# Patient Record
Sex: Male | Born: 1995 | Race: White | Hispanic: No | Marital: Single | Smoking: Current every day smoker
Health system: Southern US, Community
[De-identification: ages and names within clinical notes are randomized; demographics above are authoritative.]

## PROBLEM LIST (undated history)

## (undated) DIAGNOSIS — K219 Gastro-esophageal reflux disease without esophagitis: Secondary | ICD-10-CM

## (undated) HISTORY — PX: OTHER SURGICAL HISTORY: SHX169

---

## 2016-02-14 ENCOUNTER — Emergency Department (HOSPITAL_COMMUNITY)
Admission: EM | Admit: 2016-02-14 | Discharge: 2016-02-15 | Disposition: A | Discharge status: Home / Self Care | Payer: Self-pay | Attending: Emergency Medicine | Admitting: Emergency Medicine

## 2016-02-14 ENCOUNTER — Encounter (HOSPITAL_COMMUNITY): Payer: Self-pay

## 2016-02-14 ENCOUNTER — Emergency Department (HOSPITAL_COMMUNITY): Payer: Self-pay

## 2016-02-14 DIAGNOSIS — W230XXA Caught, crushed, jammed, or pinched between moving objects, initial encounter: Secondary | ICD-10-CM | POA: Insufficient documentation

## 2016-02-14 DIAGNOSIS — Y990 Civilian activity done for income or pay: Secondary | ICD-10-CM | POA: Insufficient documentation

## 2016-02-14 DIAGNOSIS — S62639B Displaced fracture of distal phalanx of unspecified finger, initial encounter for open fracture: Secondary | ICD-10-CM

## 2016-02-14 DIAGNOSIS — Y9289 Other specified places as the place of occurrence of the external cause: Secondary | ICD-10-CM | POA: Insufficient documentation

## 2016-02-14 DIAGNOSIS — S62665A Nondisplaced fracture of distal phalanx of left ring finger, initial encounter for closed fracture: Secondary | ICD-10-CM | POA: Insufficient documentation

## 2016-02-14 DIAGNOSIS — S61215A Laceration without foreign body of left ring finger without damage to nail, initial encounter: Secondary | ICD-10-CM

## 2016-02-14 DIAGNOSIS — Y9389 Activity, other specified: Secondary | ICD-10-CM | POA: Insufficient documentation

## 2016-02-14 MED ORDER — LIDOCAINE HCL (PF) 1 % IJ SOLN
30.0000 mL | Freq: Once | INTRAMUSCULAR | Status: AC
  Administered 2016-02-14: 30 mL
  Filled 2016-02-14: qty 30

## 2016-02-14 MED ORDER — TETANUS-DIPHTH-ACELL PERTUSSIS 5-2.5-18.5 LF-MCG/0.5 IM SUSP
0.5000 mL | Freq: Once | INTRAMUSCULAR | Status: AC
Start: 2016-02-14 — End: 2016-02-14
  Administered 2016-02-14: 0.5 mL via INTRAMUSCULAR
  Filled 2016-02-14: qty 0.5

## 2016-02-14 NOTE — ED Triage Notes (Signed)
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 Laceration noted. Injury occurred around 1930. Bleeding controlled. A&Ox4.

## 2016-02-14 NOTE — ED Provider Notes (Signed)
[facility]-EMERGENCY DEPT Provider Note   CSN: [id number] Arrival date & time: 02/14/16  2028     History   Chief Complaint Chief Complaint  Patient presents with  . Finger Injury    L ring finger    HPI Javier Perkins is a 21 y.o. male.  Javier Perkins is a 21 y.o. Male who is right hand dominant who presents to the ED complaining of a laceration to his left fourth finger sustained prior to arrival. 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 He reports pain overlying this finger. He is taking nothing for treatment of his symptoms currently. He combines of only mild pain.He is unsure when his last tetanus shot was. He denies numbness, tingling, weakness or other injury.   The history is provided by the patient. No language interpreter was used.    History reviewed. No pertinent past medical history.  There are no active problems to display for this patient.   No past surgical history on file.     Home Medications    Prior to Admission medications   Medication Sig Start Date End Date Taking? Authorizing Provider  cephALEXin (KEFLEX) 500 MG capsule Take 1 capsule (500 mg total) by mouth 3 (three) times daily. 02/15/16   Everlene Farrier, PA-C  naproxen (NAPROSYN) 500 MG tablet Take 1 tablet (500 mg total) by mouth 2 (two) times daily with a meal. 02/15/16   Everlene Farrier, PA-C    Family History History reviewed. No pertinent family history.  Social History Social History  Substance Use Topics  . Smoking status: Not on file  . Smokeless tobacco: Not on file  . Alcohol use Not on file     Allergies   Patient has no known allergies.   Review of Systems Review of Systems  Constitutional: Negative for fever.  Musculoskeletal: Positive for arthralgias.  Skin: Positive for wound.  Neurological: Negative for weakness and numbness.     Physical Exam Updated Vital Signs BP (!)  184/119 (BP Location: Left Arm)   Pulse 104   Temp 99 F (37.2 C) (Oral)   Resp 18   SpO2 100%   Physical Exam  Constitutional: He appears well-developed and well-nourished. No distress.  HENT:  Head: Normocephalic and atraumatic.  Eyes: Right eye exhibits no discharge. Left eye exhibits no discharge.  Cardiovascular: Normal rate, regular rhythm and intact distal pulses.   Bilateral radial pulses are intact. Good capillary refill to his left ring finger.  Pulmonary/Chest: Effort normal. No respiratory distress.  Musculoskeletal:  2 cm laceration noted to the volar aspect of his left fourth finger with some ecchymosis noted. No deformity. Some decreased range of motion with flexion of his left fourth finger at his DIP joint due to pain. Patient has good flexion and extension of his finger.  Neurological: He is alert. Coordination normal.  Sensation is intact to his left distal fingertips.  Skin: Skin is warm and dry. Capillary refill takes less than 2 seconds. No rash noted. He is not diaphoretic. No erythema. No pallor.  Psychiatric: He has a normal mood and affect. His behavior is normal.  Nursing note and vitals reviewed.      ED Treatments / Results  Labs (all labs ordered are listed, but only abnormal results are displayed) Labs Reviewed - No data to display  EKG  EKG Interpretation None       Radiology Dg Finger Ring Left  Result Date: 02/14/2016 CLINICAL DATA:  Smashed finger, laceration EXAM: LEFT RING FINGER 2+V COMPARISON:  None. FINDINGS: Vertically oriented lucencies extending from the tuft to the articular surface of the fourth distal digit are suspicious for nondisplaced fracture. No subluxation. No radiopaque foreign body. IMPRESSION: Nondisplaced linear fracture lucencies extending from tuft to the articular surface of the fourth distal phalanx. Electronically Signed   By: Jasmine PangKim  Fujinaga M.D.   On: 02/14/2016 21:42    Procedures .Marland Kitchen.Laceration  Repair Date/Time: 02/15/2016 12:15 AM Performed by: Everlene FarrierANSIE, Carollee Nussbaumer Authorized by: Everlene FarrierANSIE, Asya Derryberry   Consent:    Consent obtained:  Verbal   Consent given by:  Patient   Risks discussed:  Infection, pain, tendon damage and need for additional repair Anesthesia (see MAR for exact dosages):    Anesthesia method:  Nerve block   Block location:  Digital block of left ring finger    Block needle gauge:  25 G   Block anesthetic:  Lidocaine 1% w/o epi   Block technique:  Digital block    Block injection procedure:  Anatomic landmarks identified, negative aspiration for blood and anatomic landmarks palpated   Block outcome:  Anesthesia achieved Laceration details:    Location:  Finger   Finger location:  L long finger   Length (cm):  2 Repair type:    Repair type:  Simple Pre-procedure details:    Preparation:  Patient was prepped and draped in usual sterile fashion and imaging obtained to evaluate for foreign bodies Exploration:    Hemostasis achieved with:  Direct pressure   Wound exploration: wound explored through full range of motion and entire depth of wound probed and visualized     Wound extent: underlying fracture     Wound extent: no foreign bodies/material noted   Treatment:    Area cleansed with:  Saline   Amount of cleaning:  Extensive   Irrigation solution:  Sterile saline   Irrigation volume:  1000 ml   Irrigation method:  Pressure wash Skin repair:    Repair method:  Sutures   Suture size:  4-0   Suture material:  Prolene   Suture technique:  Simple interrupted   Number of sutures:  3 Approximation:    Approximation:  Loose Post-procedure details:    Dressing:  Antibiotic ointment, non-adherent dressing and splint for protection   Patient tolerance of procedure:  Tolerated well, no immediate complications   (including critical care time)  Medications Ordered in ED Medications  lidocaine (PF) (XYLOCAINE) 1 % injection 30 mL (not administered)  Tdap (BOOSTRIX)  injection 0.5 mL (0.5 mLs Intramuscular Given 02/14/16 2322)  naproxen (NAPROSYN) tablet 500 mg (500 mg Oral Given 02/15/16 0016)  cephALEXin (KEFLEX) capsule 500 mg (500 mg Oral Given 02/15/16 0015)     Initial Impression / Assessment and Plan / ED Course  I have reviewed the triage vital signs and the nursing notes.  Pertinent labs & imaging results that were available during my care of the patient were reviewed by me and considered in my medical decision making (see chart for details).     patient who is right-hand dominant presents with open fracture and laceration to his left ring finger. See picture above for details. Is neurovascularly intact. X-ray shows a nondisplaced linear fracture extending from the tuft to the articular surface of the fourth distal phalanx. I consulted with hand surgeon Dr. Mina MarbleWeingold who agrees with plan for irrigation, laceration repair, finger splint and discharged with Keflex with him following up in office.  I performed a digital block successfully. I did a laceration repair was tolerated well by the patient here patient was covered with Xeroform dressing and placed in a finger splint. I discussed wound care precautions and splint care precautions. Keflex at discharge. Tetanus updated in the ED. I advised to call Dr. Ronie Spies office for follow-up appointment. I advised the patient to follow-up with their primary care provider this week. I advised the patient to return to the emergency department with new or worsening symptoms or new concerns. The patient verbalized understanding and agreement with plan.      Final Clinical Impressions(s) / ED Diagnoses   Final diagnoses:  Open fracture of tuft of distal phalanx of finger  Laceration of left ring finger without foreign body without damage to nail, initial encounter    New Prescriptions New Prescriptions   CEPHALEXIN (KEFLEX) 500 MG CAPSULE    Take 1 capsule (500 mg total) by mouth 3 (three) times daily.    NAPROXEN (NAPROSYN) 500 MG TABLET    Take 1 tablet (500 mg total) by mouth 2 (two) times daily with a meal.     Everlene Farrier, PA-C 02/15/16 0018    Linwood Dibbles, MD 02/16/16 2128

## 2016-02-15 MED ORDER — NAPROXEN 500 MG PO TABS
500.0000 mg | ORAL_TABLET | Freq: Once | ORAL | Status: AC
  Administered 2016-02-15: 500 mg via ORAL
  Filled 2016-02-15: qty 1

## 2016-02-15 MED ORDER — CEPHALEXIN 500 MG PO CAPS
500.0000 mg | ORAL_CAPSULE | Freq: Once | ORAL | Status: AC
  Administered 2016-02-15: 500 mg via ORAL
  Filled 2016-02-15: qty 1

## 2016-02-15 MED ORDER — CEPHALEXIN 500 MG PO CAPS: 500.0000 mg | ORAL_CAPSULE | Freq: Three times a day (TID) | ORAL | 0 refills | Status: DC

## 2016-02-15 MED ORDER — NAPROXEN 500 MG PO TABS: 500.0000 mg | ORAL_TABLET | Freq: Two times a day (BID) | ORAL | 0 refills | Status: DC

## 2019-01-07 ENCOUNTER — Encounter: Payer: Self-pay | Admitting: Nurse Practitioner

## 2019-01-26 ENCOUNTER — Encounter: Payer: Self-pay | Admitting: Nurse Practitioner

## 2019-01-26 ENCOUNTER — Ambulatory Visit (INDEPENDENT_AMBULATORY_CARE_PROVIDER_SITE_OTHER): Payer: BC Managed Care – PPO | Admitting: Nurse Practitioner

## 2019-01-26 VITALS — BP 140/84 | HR 88 | Temp 96.5°F | Ht 75.0 in | Wt 318.8 lb

## 2019-01-26 DIAGNOSIS — R634 Abnormal weight loss: Secondary | ICD-10-CM

## 2019-01-26 DIAGNOSIS — R6881 Early satiety: Secondary | ICD-10-CM | POA: Diagnosis not present

## 2019-01-26 DIAGNOSIS — K219 Gastro-esophageal reflux disease without esophagitis: Secondary | ICD-10-CM

## 2019-01-26 NOTE — Progress Notes (Signed)
ASSESSMENT / PLAN:   45.  24 year old male with recent period of early satiety / significant weight loss in the absence of any nausea / vomiting.  He has lost about 50 pounds over the last few months but attributes a lot of this to physical labor involved with [employer] job he got in November.  His appetite is nearly back to normal, weight has stabilized.  Etiology of the recent period of early satiety is unclear but will need to be monitored closely. -I have asked patient to return to see me within a couple of weeks.  We will check his weight and see how things are going.  Low threshold for proceeding with EGD.   2. GERD, longstanding.  Patient gets daily heartburn but he takes PPI only on an as-needed basis when symptoms get really bad. -With daily symptoms I think he needs maintenance therapy.  Recommended Pepcid 20 mg daily for now.  This may need to be increased or even change to a daily PPI.  This can be decided at our next visit in a couple of weeks. -Antireflux measures discussed.  He drinks a lot of sweet tea but will reduce caffeine intake as much as possible  HPI:    Referring Provider:    Guy Perkins, P.A-C   Reason for referral:    Early satiety, history of GERD  Chief Complaint:   Early satiety, GERD   Javier Perkins is a 24 yo male who in December developed early satiety getting full after just a few bites of food. He subsequently lost ~ 50 pounds but feels like much of the weight loss is attributable to increased physical activity associated with new job with [employer] started in November.  He never did have any nausea or vomiting associated with the early satiety. No night sweats. He hadn't started any new medications.  He inquires about the possibility of having a stomach ulcer.  He had been taking NSAIDs for about 1.5 months until just recently.  He has not had any NSAIDs since December and his symptoms are getting better.  Since making this appointment his appetite has nearly  returned to normal, weight is at at plateau. BMs normal.   Javier Perkins has a longstanding history of GERD. He gets heartburn on a daily basis.  When symptoms persist or get really bad he takes Nexium for a week or so.   History reviewed. No pertinent past medical history.   Past Surgical History:  Procedure Laterality Date  . none     No family history on file. Social History   Tobacco Use  . Smoking status: Not on file  Substance Use Topics  . Alcohol use: Not on file  . Drug use: Not on file   Current Outpatient Medications  Medication Sig Dispense Refill  . cephALEXin (KEFLEX) 500 MG capsule Take 1 capsule (500 mg total) by mouth 3 (three) times daily. 21 capsule 0  . naproxen (NAPROSYN) 500 MG tablet Take 1 tablet (500 mg total) by mouth 2 (two) times daily with a meal. 30 tablet 0   No current facility-administered medications for this visit.   No Known Allergies   Review of Systems: All systems reviewed and negative except where noted in HPI.   Physical Exam:    Wt Readings from Last 3 Encounters:  No data found for Wt    There were no vitals taken for this visit.  Constitutional:  Pleasant male in no acute distress. Psychiatric: Normal mood and affect. Behavior is normal. EENT: Pupils normal.  Conjunctivae are normal. No scleral icterus. Neck supple.  Cardiovascular: Normal rate, regular rhythm. No edema Pulmonary/chest: Effort normal and breath sounds normal. No wheezing, rales or rhonchi. Abdominal: Soft, nondistended, nontender. Bowel sounds active throughout. There are no masses palpable. No hepatomegaly. Neurological: Alert and oriented to person place and time. Skin: Skin is warm and dry. No rashes noted.  Javier Savoy, NP  01/26/2019, 3:07 PM  Cc: Javier Malling, PA

## 2019-01-26 NOTE — Patient Instructions (Addendum)
If you are age 25 or older, your body mass index should be between 23-30. Your Body mass index is 39.85 kg/m. If this is out of the aforementioned range listed, please consider follow up with your Primary Care Provider.  If you are age 42 or younger, your body mass index should be between 19-25. Your Body mass index is 39.85 kg/m. If this is out of the aformentioned range listed, please consider follow up with your Primary Care Provider.   We have sent the following medications to your pharmacy for you to pick up at your convenience: Start Pepcid 20 mg every morning (this is over-the-counter)  NO NSAIDS  ie Motrin, Advil, Aleve, Ibuprofen, etc.  You have been given GERD literature.  We are requesting a copy of recent labs from PCP.  You have been given a work excuse today.  Follow up with me on 02/09/19 at 3:30 pm.  Thank you for choosing me and [facility].   Willette Cluster, NP

## 2019-01-27 ENCOUNTER — Encounter: Payer: Self-pay | Admitting: Nurse Practitioner

## 2019-01-27 NOTE — Progress Notes (Signed)
Attending Physician's Attestation   I have reviewed the chart.   I agree with the Advanced Practitioner's note, impression, and recommendations with any updates as below.  Thankfully it seems that the weight has stabilized.  However if any recurrence or progressive weight loss then endoscopic evaluation seems reasonable both from above and below.  Cross-sectional imaging may be necessary as well.  Agree with PPI use rather than as needed use to see how he does.  We will look forward to seeing how weight does and follow-up.  Javier Parish, MD [facility] Advanced Endoscopy Office # [phone]

## 2019-02-09 ENCOUNTER — Ambulatory Visit: Payer: BC Managed Care – PPO | Admitting: Nurse Practitioner

## 2020-05-03 ENCOUNTER — Encounter (HOSPITAL_COMMUNITY): Payer: Self-pay

## 2020-05-03 ENCOUNTER — Emergency Department (HOSPITAL_COMMUNITY): Payer: BC Managed Care – PPO

## 2020-05-03 ENCOUNTER — Emergency Department (HOSPITAL_COMMUNITY)
Admission: EM | Admit: 2020-05-03 | Discharge: 2020-05-03 | Disposition: A | Discharge status: Home / Self Care | Payer: BC Managed Care – PPO | Attending: Emergency Medicine | Admitting: Emergency Medicine

## 2020-05-03 DIAGNOSIS — R1031 Right lower quadrant pain: Secondary | ICD-10-CM | POA: Diagnosis present

## 2020-05-03 DIAGNOSIS — K219 Gastro-esophageal reflux disease without esophagitis: Secondary | ICD-10-CM | POA: Insufficient documentation

## 2020-05-03 DIAGNOSIS — K429 Umbilical hernia without obstruction or gangrene: Secondary | ICD-10-CM | POA: Insufficient documentation

## 2020-05-03 DIAGNOSIS — D72829 Elevated white blood cell count, unspecified: Secondary | ICD-10-CM | POA: Diagnosis not present

## 2020-05-03 DIAGNOSIS — F17290 Nicotine dependence, other tobacco product, uncomplicated: Secondary | ICD-10-CM | POA: Diagnosis not present

## 2020-05-03 HISTORY — DX: Gastro-esophageal reflux disease without esophagitis: K21.9

## 2020-05-03 LAB — CBC WITH DIFFERENTIAL/PLATELET
Abs Immature Granulocytes: 0.09 10*3/uL — ABNORMAL HIGH (ref 0.00–0.07)
Basophils Absolute: 0.1 10*3/uL (ref 0.0–0.1)
Basophils Relative: 1 %
Eosinophils Absolute: 0.3 10*3/uL (ref 0.0–0.5)
Eosinophils Relative: 2 %
HCT: 46.5 % (ref 39.0–52.0)
Hemoglobin: 15.9 g/dL (ref 13.0–17.0)
Immature Granulocytes: 1 %
Lymphocytes Relative: 19 %
Lymphs Abs: 2.5 10*3/uL (ref 0.7–4.0)
MCH: 30.8 pg (ref 26.0–34.0)
MCHC: 34.2 g/dL (ref 30.0–36.0)
MCV: 89.9 fL (ref 80.0–100.0)
Monocytes Absolute: 0.9 10*3/uL (ref 0.1–1.0)
Monocytes Relative: 7 %
Neutro Abs: 9.1 10*3/uL — ABNORMAL HIGH (ref 1.7–7.7)
Neutrophils Relative %: 70 %
Platelets: 282 10*3/uL (ref 150–400)
RBC: 5.17 MIL/uL (ref 4.22–5.81)
RDW: 12.1 % (ref 11.5–15.5)
WBC: 13 10*3/uL — ABNORMAL HIGH (ref 4.0–10.5)
nRBC: 0 % (ref 0.0–0.2)

## 2020-05-03 LAB — COMPREHENSIVE METABOLIC PANEL
ALT: 39 U/L (ref 0–44)
AST: 20 U/L (ref 15–41)
Albumin: 4.6 g/dL (ref 3.5–5.0)
Alkaline Phosphatase: 63 U/L (ref 38–126)
Anion gap: 7 (ref 5–15)
BUN: 10 mg/dL (ref 6–20)
CO2: 28 mmol/L (ref 22–32)
Calcium: 9.2 mg/dL (ref 8.9–10.3)
Chloride: 102 mmol/L (ref 98–111)
Creatinine, Ser: 0.98 mg/dL (ref 0.61–1.24)
GFR, Estimated: >60 mL/min (ref >60)
Glucose, Bld: 111 mg/dL — ABNORMAL HIGH (ref 70–99)
Potassium: 3.8 mmol/L (ref 3.5–5.1)
Sodium: 137 mmol/L (ref 135–145)
Total Bilirubin: 0.5 mg/dL (ref 0.3–1.2)
Total Protein: 7.9 g/dL (ref 6.5–8.1)

## 2020-05-03 LAB — URINALYSIS, ROUTINE W REFLEX MICROSCOPIC
Bacteria, UA: NONE SEEN
Bilirubin Urine: NEGATIVE
Glucose, UA: NEGATIVE mg/dL
Ketones, ur: NEGATIVE mg/dL
Leukocytes,Ua: NEGATIVE
Nitrite: NEGATIVE
Protein, ur: NEGATIVE mg/dL
Specific Gravity, Urine: 1.018 (ref 1.005–1.030)
pH: 7 (ref 5.0–8.0)

## 2020-05-03 LAB — LIPASE, BLOOD: Lipase: 21 U/L (ref 11–51)

## 2020-05-03 MED ORDER — IOHEXOL 300 MG/ML  SOLN
100.0000 mL | Freq: Once | INTRAMUSCULAR | Status: AC | PRN
  Administered 2020-05-03: 100 mL via INTRAVENOUS

## 2020-05-03 MED ORDER — OXYCODONE-ACETAMINOPHEN 5-325 MG PO TABS: 1.0000 | ORAL_TABLET | Freq: Four times a day (QID) | ORAL | 0 refills | Status: AC | PRN

## 2020-05-03 NOTE — ED Provider Notes (Signed)
[facility]-EMERGENCY DEPT Provider Note   CSN: [id number] Arrival date & time: 05/03/20  0920     History Chief Complaint  Patient presents with  . Abdominal Pain    Javier Perkins is a 25 y.o. male with a history of GERD not currently on any medications.  Patient presents with a chief complaint of abdominal pain.  Patient reports that his abdominal pain started yesterday morning.  Pain started in his lower right quadrant yesterday morning.  Patient reports that pain became worse yesterday evening and he moved to be periumbilical.  Patient woke this morning with additional worsening of his pain.  Patient reports that his pain is constant and rates it 3/10 on the pain scale.  Patient reports that pain is worse when he is bending, coughing, or making any sudden movements.  Patient reports that he has been able to eat and drink without difficulty.  Patient endorses diarrhea over the last 4 days.  Patient denies any fevers, chills, nausea, vomiting, blood in stool, melena, constipation dysuria, hematuria, urinary urgency, purulent discharge, tenderness to genitals, swelling to genitals, genital sores or lesions.  Patient denies any history of abdominal surgeries.  Patient denies any regular alcohol use or NSAID use.  HPI     Past Medical History:  Diagnosis Date  . GERD (gastroesophageal reflux disease)     There are no problems to display for this patient.   Past Surgical History:  Procedure Laterality Date  . none         Family History  Problem Relation Age of Onset  . Esophageal cancer Neg Hx   . Colon cancer Neg Hx   . Pancreatic cancer Neg Hx   . Stomach cancer Neg Hx   . Liver disease Neg Hx     Social History   Tobacco Use  . Smoking status: Current Every Day Smoker    Types: E-cigarettes  . Smokeless tobacco: Never Used  Substance Use Topics  . Alcohol use: Yes    Comment: occasional  . Drug use: Not Currently    Home  Medications Prior to Admission medications   Not on File    Allergies    Patient has no known allergies.  Review of Systems   Review of Systems  Constitutional: Negative for chills and fever.  Eyes: Negative for visual disturbance.  Respiratory: Negative for shortness of breath.   Cardiovascular: Negative for chest pain.  Gastrointestinal: Positive for abdominal pain and diarrhea. Negative for abdominal distention, anal bleeding, blood in stool, constipation, nausea, rectal pain and vomiting.  Genitourinary: Negative for decreased urine volume, difficulty urinating, dysuria, flank pain, frequency, genital sores, hematuria, penile discharge, penile pain, penile swelling, scrotal swelling, testicular pain and urgency.  Musculoskeletal: Negative for back pain and neck pain.  Skin: Negative for color change and rash.  Neurological: Negative for dizziness, syncope, light-headedness and headaches.  Psychiatric/Behavioral: Negative for confusion.    Physical Exam Updated Vital Signs BP (!) 157/106 (BP Location: Right Arm)   Pulse 93   Temp 98.8 F (37.1 C) (Oral)   Resp 16   SpO2 100%   Physical Exam Vitals and nursing note reviewed.  Constitutional:      General: He is not in acute distress.    Appearance: He is not ill-appearing, toxic-appearing or diaphoretic.  HENT:     Head: Normocephalic.  Eyes:     General: No scleral icterus.       Right eye: No discharge.  Left eye: No discharge.  Cardiovascular:     Rate and Rhythm: Normal rate.  Pulmonary:     Effort: Pulmonary effort is normal.     Breath sounds: Normal breath sounds.  Abdominal:     General: Bowel sounds are normal. There is no distension. There are no signs of injury.     Palpations: Abdomen is soft. There is pulsatile mass. There is no mass.     Tenderness: There is abdominal tenderness in the periumbilical area. There is no right CVA tenderness, left CVA tenderness, guarding or rebound. Negative signs  include psoas sign.     Hernia: There is no hernia in the umbilical area or ventral area.  Musculoskeletal:     Cervical back: Neck supple.     Right lower leg: No swelling or tenderness. No edema.     Left lower leg: No swelling or tenderness. No edema.  Skin:    General: Skin is warm and dry.     Coloration: Skin is not cyanotic, jaundiced or pale.  Neurological:     General: No focal deficit present.     Mental Status: He is alert.  Psychiatric:        Behavior: Behavior is cooperative.     ED Results / Procedures / Treatments   Labs (all labs ordered are listed, but only abnormal results are displayed) Labs Reviewed  CBC WITH DIFFERENTIAL/PLATELET - Abnormal; Notable for the following components:      Result Value   WBC 13.0 (*)    Neutro Abs 9.1 (*)    Abs Immature Granulocytes 0.09 (*)    All other components within normal limits  COMPREHENSIVE METABOLIC PANEL - Abnormal; Notable for the following components:   Glucose, Bld 111 (*)    All other components within normal limits  URINALYSIS, ROUTINE W REFLEX MICROSCOPIC - Abnormal; Notable for the following components:   Hgb urine dipstick SMALL (*)    All other components within normal limits  LIPASE, BLOOD    EKG None  Radiology CT ABDOMEN PELVIS W CONTRAST  Result Date: 05/03/2020 CLINICAL DATA:  Right lower quadrant pain.  Rule out appendicitis. EXAM: CT ABDOMEN AND PELVIS WITH CONTRAST TECHNIQUE: Multidetector CT imaging of the abdomen and pelvis was performed using the standard protocol following bolus administration of intravenous contrast. CONTRAST:  OMNIPAQUE IOHEXOL 300 MG/ML  SOLN COMPARISON:  None. FINDINGS: Lower chest: No acute abnormality. Hepatobiliary: No focal liver abnormality is seen. No gallstones, gallbladder wall thickening, or biliary dilatation. Pancreas: Unremarkable. No pancreatic ductal dilatation or surrounding inflammatory changes. Spleen: The spleen measures 13.7 x 5.7 by 13.4 cm  (volume = 550 cm^3) Adrenals/Urinary Tract: Normal adrenal glands. No kidney stone, mass or hydronephrosis identified bilaterally. Urinary bladder is unremarkable. Stomach/Bowel: The stomach appears nondistended. The appendix is visualized and is within normal limits. No bowel wall thickening, inflammation, or distension. Vascular/Lymphatic: No significant vascular findings are present. No enlarged abdominal or pelvic lymph nodes. Reproductive: Prostate is unremarkable. Other: Umbilical hernia is identified which contains fat. This measures 2 x 2.3 by 2.1 cm, image 99/5 and image 58/2. Stranding and a small amount of fluid is identified associated with the herniated fat. Findings are concerning for incarceration with fat necrosis. Musculoskeletal: No acute or suspicious osseous findings. IMPRESSION: 1. No evidence for acute appendicitis. 2. Umbilical hernia is identified which contains fat. There is a hyperdense rim surrounding the herniated fat along with a small amount of fluid and soft tissue stranding. Suspicious for incarceration and  fat necrosis. Correlate for focal tenderness over the area of the umbilicus 3. Splenomegaly. Electronically Signed   By: Signa Kell M.D.   On: 05/03/2020 12:32    Procedures Procedures   Medications Ordered in ED Medications  iohexol (OMNIPAQUE) 300 MG/ML solution 100 mL (100 mLs Intravenous Contrast Given 05/03/20 1154)    ED Course  I have reviewed the triage vital signs and the nursing notes.  Pertinent labs & imaging results that were available during my care of the patient were reviewed by me and considered in my medical decision making (see chart for details).    MDM Rules/Calculators/A&P                          Alert 25 year old male no acute distress, nontoxic-appearing.  Patient presents with chief complaint of periumbilical pain.  Patient reports pain darted yesterday and has progressively worsened since then.  Patient complains of increased pain  with movement or coughing.  Patient reports pain is constant and rates pain 3/10 on the pain scale.  Patient has been able to tolerate p.o. intake without difficulty.  Denies any fevers, chills, nausea, vomiting, urinary symptoms.  Patient denies any history of abdominal surgeries, regular NSAID use, regular alcohol use.  On physical exam abdomen soft, nondistended, tenderness to periumbilical region.  No guarding, rebound tenderness, CVA tenderness, psoas sign observed.  Will obtain lipase, urinalysis, CMP, CBC.  Patient's pain manageable at this time.  Lipase is unremarkable; low suspicion for acute pancreatitis. Urinalysis shows no signs of infection. CMP is unremarkable; low suspicion for hepatobiliary disease. CBC shows leukocytosis at 13.  On repeat examination patient abdomen continues to be soft, nondistended with tenderness to periumbilical region.  Will obtain CT abdomen pelvis with contrast to evaluate for possible small bowel obstruction or appendicitis.  CT abdomen pelvis shows: 1) no evidence of for acute appendicitis. 2) fat-containing umbilical hernia.  There is a small hyperdense rim surrounding the herniated fat along with a small amount of fluid and soft tissue stranding; suspicious for incarceration of fat necrosis.  On repeat examination abdomen remains soft, nondistended with periumbilical tenderness.  Unable to identify any umbilical hernia.  Due to findings will consult general surgery. Imaging was reviewed and patient was evaluated by Dr. Dossie Der.  Dr. Dossie Der reports that patient was given option for renal hernia repair today versus in the outpatient setting.  Patient chose to have surgery done in outpatient setting.  Will prescribe patient with 3 days of Percocet medication to use as needed for pain.  Discussed results, findings, treatment and follow up. Patient advised of return precautions. Patient verbalized understanding and agreed with plan.   Final  Clinical Impression(s) / ED Diagnoses Final diagnoses:  Umbilical hernia without obstruction and without gangrene    Rx / DC Orders ED Discharge Orders         Ordered    oxyCODONE-acetaminophen (PERCOCET/ROXICET) 5-325 MG tablet  Every 6 hours PRN        05/03/20 1344           Berneice Heinrich 05/03/20 2149    Derwood Kaplan, MD 05/04/20 2225

## 2020-05-03 NOTE — Discharge Instructions (Addendum)
You came to the emergency department today to be evaluated for your abdominal pain.  Your CT scan showed that you have an umbilical hernia.  We spoke to the general surgeon Dr.Stechschulte about this issue in the emergency department today.  You elected not to have surgery at this time.  I have given you prescription for pain medication to use as needed over the next 3 days.  You may take 1 pill every 6 hours as needed.  Follow-up with Dr.Stechschulte.    You are being prescribed a medication which may make you sleepy. Please follow up of listed precautions for at least 24 hours after taking one dose.  For the next 24 hours please do not drive, operate heavy machinery, care for a small child with out another adult present, or perform any activities that may cause harm to you or someone else if you were to fall asleep or be impaired.    Get help right away if: You develop sudden, severe pain near the area of your hernia. You have pain as well as nausea or vomiting. You have pain and the skin over your hernia changes color. You develop a fever.

## 2020-05-03 NOTE — Consult Note (Signed)
Consulting Physician: Hyman Hopes Toniya Rozar  Referring Provider: Derwood Kaplan, MD  Chief Complaint: Abdominal pain  Reason for Consult: Umbilical hernia   Subjective   HPI: Javier Perkins is an 26 y.o. male who is here for abdominal pain.  The pain started in the last 24 hours and slowly became worse.  It was really bad last night making it hard to sleep.  It only improved when he received some pain medication here in the hospital.  Now he is more comfortable after pain meds in the ER.  He is not keen on having surgery especially as his summer office coming up.  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  Past Medical History:  Diagnosis Date  . GERD (gastroesophageal reflux disease)     Past Surgical History:  Procedure Laterality Date  . none      Family History  Problem Relation Age of Onset  . Esophageal cancer Neg Hx   . Colon cancer Neg Hx   . Pancreatic cancer Neg Hx   . Stomach cancer Neg Hx   . Liver disease Neg Hx     Social:  reports that he has been smoking e-cigarettes. He has never used smokeless tobacco. He reports current alcohol use. He reports previous drug use.  Allergies: No Known Allergies  Medications: No current outpatient medications  ROS - all of the below systems have been reviewed with the patient and positives are indicated with bold text General: chills, fever or night sweats Eyes: blurry vision or double vision ENT: epistaxis or sore throat Allergy/Immunology: itchy/watery eyes or nasal congestion Hematologic/Lymphatic: bleeding problems, blood clots or swollen lymph nodes Endocrine: temperature intolerance or unexpected weight changes Breast: new or changing breast lumps or nipple discharge Resp: cough, shortness of breath, or wheezing CV: chest pain or dyspnea on exertion GI: as per HPI GU: dysuria, trouble voiding, or hematuria MSK: joint pain or joint stiffness Neuro: TIA or stroke symptoms Derm: pruritus and skin lesion  changes Psych: anxiety and depression  Objective   PE Blood pressure 137/74, pulse 77, temperature 98.8 F (37.1 C), temperature source Oral, resp. rate 18, SpO2 96 %. Constitutional: NAD; conversant; no deformities Eyes: Moist conjunctiva; no lid lag; anicteric; PERRL Neck: Trachea midline; no thyromegaly Lungs: Normal respiratory effort; no tactile fremitus CV: RRR; no palpable thrills; no pitting edema GI: Abd difficult to palpate umbilical hernia at the base of the umbilicus, unable to reduce; no palpable hepatosplenomegaly MSK: Normal range of motion of extremities; no clubbing/cyanosis Psychiatric: Appropriate affect; alert and oriented x3 Lymphatic: No palpable cervical or axillary lymphadenopathy  Results for orders placed or performed during the hospital encounter of 05/03/20 (from the past 24 hour(s))  CBC with Differential     Status: Abnormal   Collection Time: 05/03/20  9:38 AM  Result Value Ref Range   WBC 13.0 (H) 4.0 - 10.5 K/uL   RBC 5.17 4.22 - 5.81 MIL/uL   Hemoglobin 15.9 13.0 - 17.0 g/dL   HCT 07.3 71.0 - 62.6 %   MCV 89.9 80.0 - 100.0 fL   MCH 30.8 26.0 - 34.0 pg   MCHC 34.2 30.0 - 36.0 g/dL   RDW 94.8 54.6 - 27.0 %   Platelets 282 150 - 400 K/uL   nRBC 0.0 0.0 - 0.2 %   Neutrophils Relative % 70 %   Neutro Abs 9.1 (H) 1.7 - 7.7 K/uL   Lymphocytes Relative 19 %   Lymphs Abs 2.5 0.7 - 4.0 K/uL  Monocytes Relative 7 %   Monocytes Absolute 0.9 0.1 - 1.0 K/uL   Eosinophils Relative 2 %   Eosinophils Absolute 0.3 0.0 - 0.5 K/uL   Basophils Relative 1 %   Basophils Absolute 0.1 0.0 - 0.1 K/uL   Immature Granulocytes 1 %   Abs Immature Granulocytes 0.09 (H) 0.00 - 0.07 K/uL  Comprehensive metabolic panel     Status: Abnormal   Collection Time: 05/03/20  9:38 AM  Result Value Ref Range   Sodium 137 135 - 145 mmol/L   Potassium 3.8 3.5 - 5.1 mmol/L   Chloride 102 98 - 111 mmol/L   CO2 28 22 - 32 mmol/L   Glucose, Bld 111 (H) 70 - 99 mg/dL   BUN 10 6 -  20 mg/dL   Creatinine, Ser 7.82 0.61 - 1.24 mg/dL   Calcium 9.2 8.9 - 42.3 mg/dL   Total Protein 7.9 6.5 - 8.1 g/dL   Albumin 4.6 3.5 - 5.0 g/dL   AST 20 15 - 41 U/L   ALT 39 0 - 44 U/L   Alkaline Phosphatase 63 38 - 126 U/L   Total Bilirubin 0.5 0.3 - 1.2 mg/dL   GFR, Estimated >53 >61 mL/min   Anion gap 7 5 - 15  Lipase, blood     Status: None   Collection Time: 05/03/20  9:38 AM  Result Value Ref Range   Lipase 21 11 - 51 U/L  Urinalysis, Routine w reflex microscopic Urine, Clean Catch     Status: Abnormal   Collection Time: 05/03/20  9:38 AM  Result Value Ref Range   Color, Urine YELLOW YELLOW   APPearance CLEAR CLEAR   Specific Gravity, Urine 1.018 1.005 - 1.030   pH 7.0 5.0 - 8.0   Glucose, UA NEGATIVE NEGATIVE mg/dL   Hgb urine dipstick SMALL (A) NEGATIVE   Bilirubin Urine NEGATIVE NEGATIVE   Ketones, ur NEGATIVE NEGATIVE mg/dL   Protein, ur NEGATIVE NEGATIVE mg/dL   Nitrite NEGATIVE NEGATIVE   Leukocytes,Ua NEGATIVE NEGATIVE   RBC / HPF 0-5 0 - 5 RBC/hpf   WBC, UA 0-5 0 - 5 WBC/hpf   Bacteria, UA NONE SEEN NONE SEEN   Mucus PRESENT      Imaging Orders     CT ABDOMEN PELVIS W CONTRAST   Assessment and Plan   Javier Perkins is an 25 y.o. male with a painful umbilical hernia with incarcerated omentum.  I discussed the pathophysiology of his umbilical hernia.  I explained that omentum and fatty tissue is the only thing herniated through the umbilical hernia defect which is not a dangerous problem but is a painful problem.  I given the option proceed with umbilical hernia repair while he is here in the ER versus outpatient follow-up.  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  We will plan to schedule surgery for the fall.  In the meantime the ER providers will provide him some narcotic pain medication as the omentum infarcts inside the umbilical hernia.  I discussed the procedure of open umbilical hernia repair with mesh.  We  discussed the procedure itself as well as its risk, benefits, and alternatives.  After full discussion all questions answered the patient granted consent to proceed.  I will have my surgery scheduling office call the patient to schedule surgery.  Quentin Ore, MD  [facility] Use [email or web address] to contact on call provider

## 2020-05-03 NOTE — ED Notes (Signed)
Esignature not working for [employer].

## 2020-05-03 NOTE — ED Triage Notes (Addendum)
Pt arrived via walk in, c/o diffuse abd pain. Denies any n/v, diarrhea ore urinary at this time.

## 2021-08-29 IMAGING — CT CT ABD-PELV W/ CM
2 of 4 series · 16 of 46 positions shown, 18 images · IV contrast (omnipaque)
Comparison: None.

CLINICAL DATA: Right lower quadrant pain.  Rule out appendicitis.

EXAM:
CT ABDOMEN AND PELVIS WITH CONTRAST
TECHNIQUE: Multidetector CT imaging of the abdomen and pelvis was performed
using the standard protocol following bolus administration of
intravenous contrast.
CONTRAST:  100mL OMNIPAQUE IOHEXOL 300 MG/ML  SOLN

[Series 2: axial st · axial · 0.93mm/px · z∈[+1098,+1588]mm · 13 of 112 slices shown, 15 images]
[im 7/112  soft-tissue]
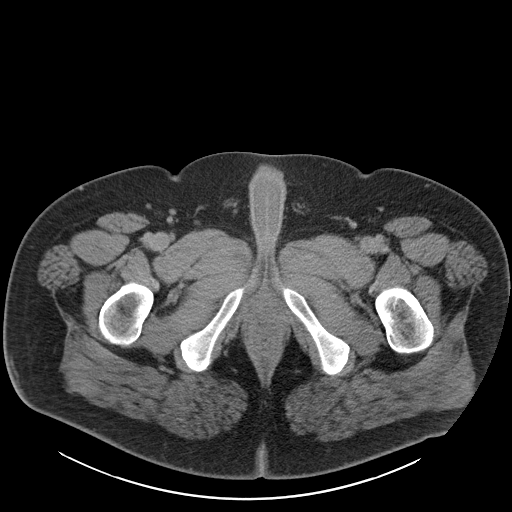
[im 7/112  bone]
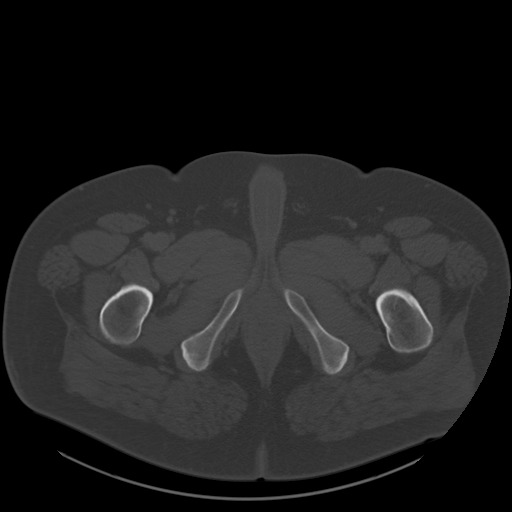
[im 14/112  soft-tissue]
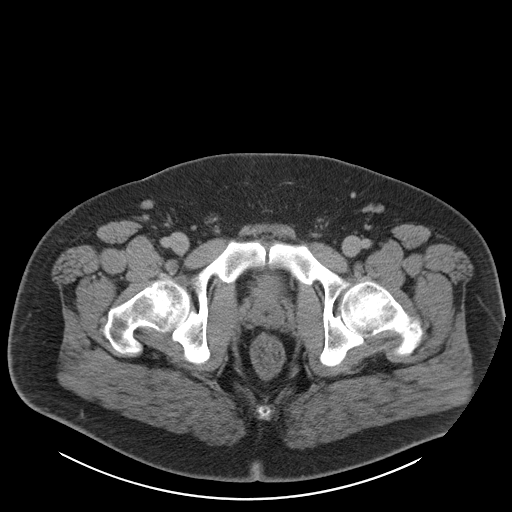
[im 21/112  soft-tissue]
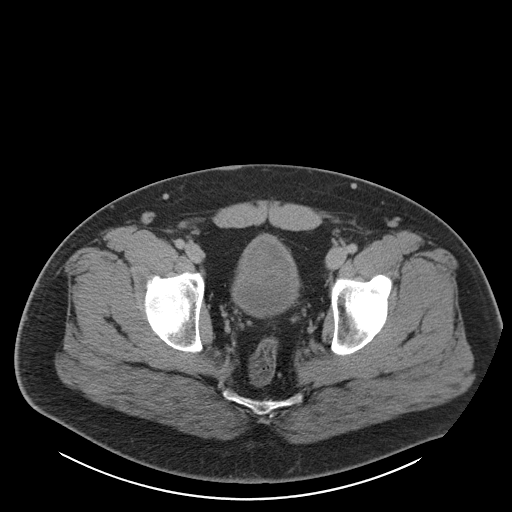
[im 35/112  soft-tissue]
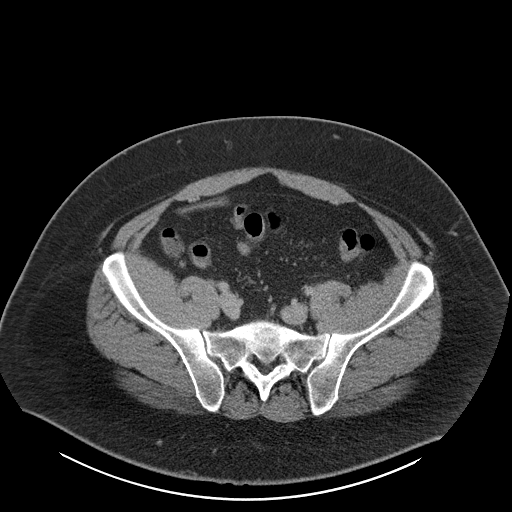
[im 42/112  soft-tissue]
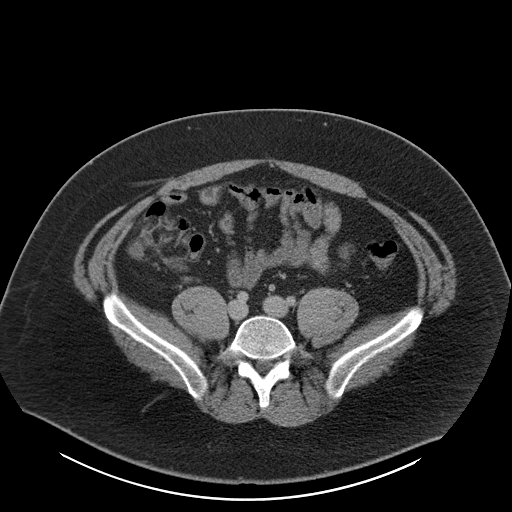
[im 49/112  soft-tissue]
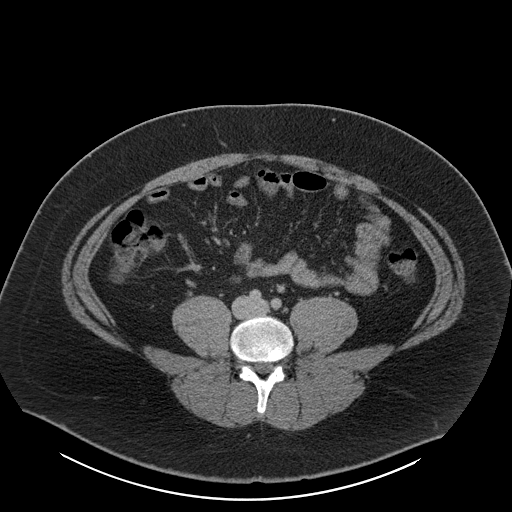
[im 56/112  soft-tissue]
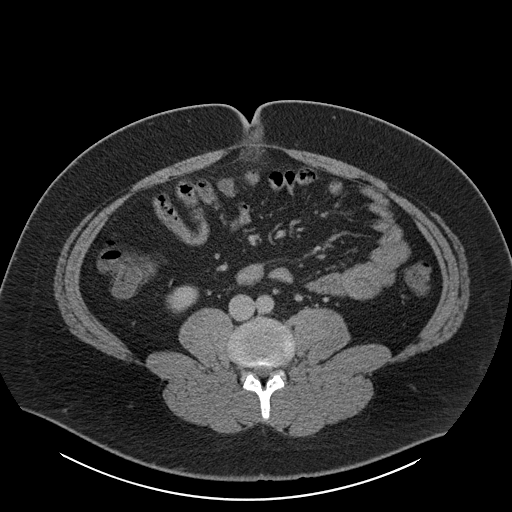
[im 63/112  soft-tissue]
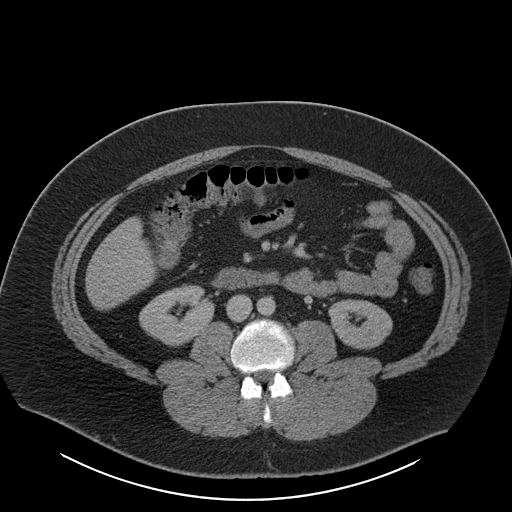
[im 70/112  soft-tissue]
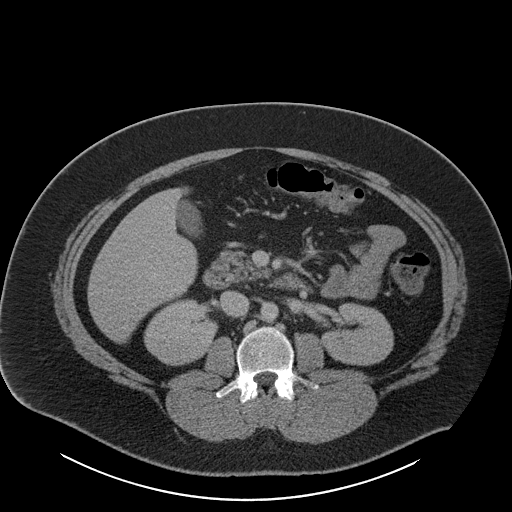
[im 70/112  bone]
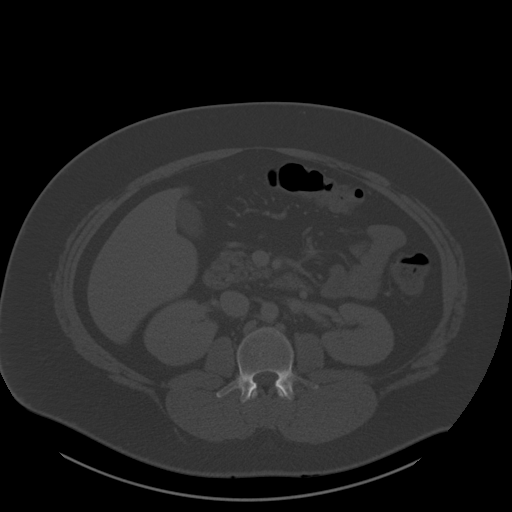
[im 77/112  soft-tissue]
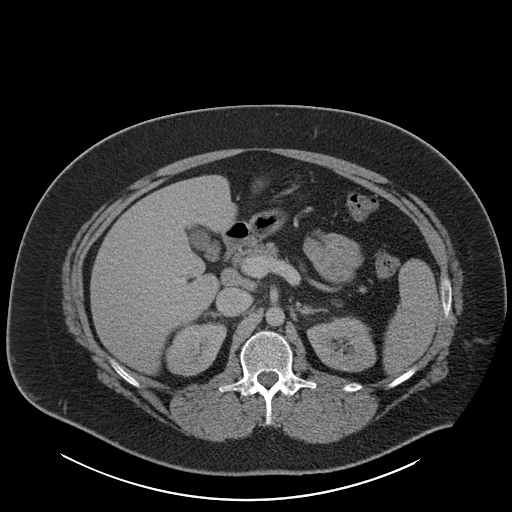
[im 91/112  soft-tissue]
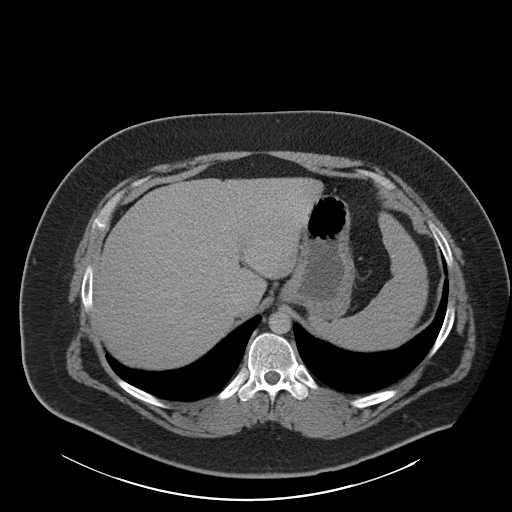
[im 98/112  soft-tissue]
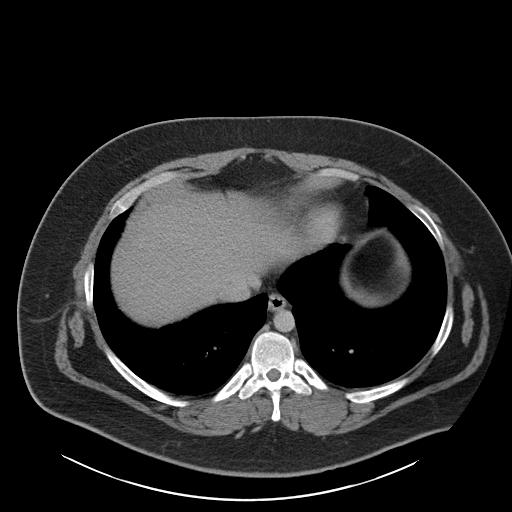
[im 105/112  soft-tissue]
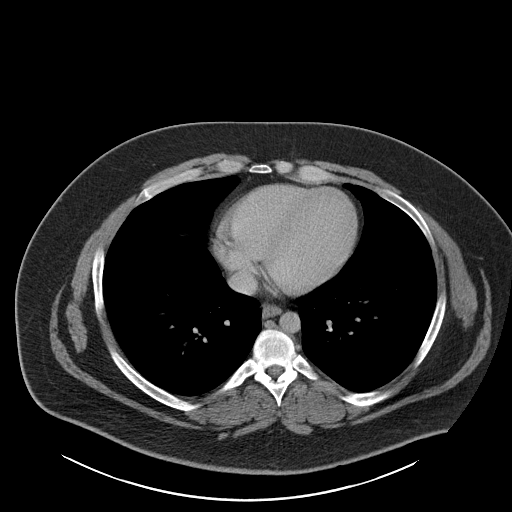

[Series 4: coronal st · coronal · 1.16mm/px · 3 of 155 slices shown]
[im 52/155  soft-tissue]
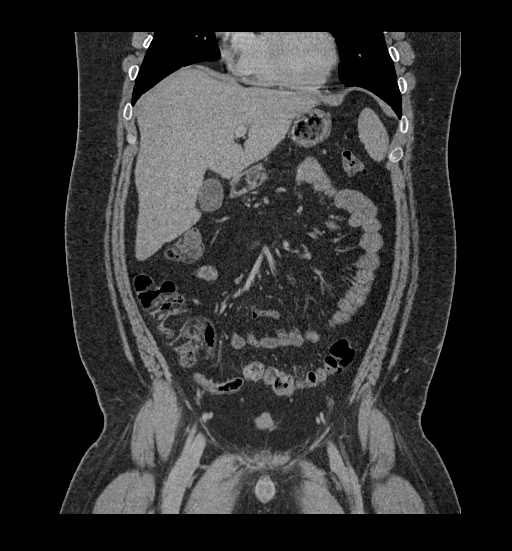
[im 69/155  soft-tissue]
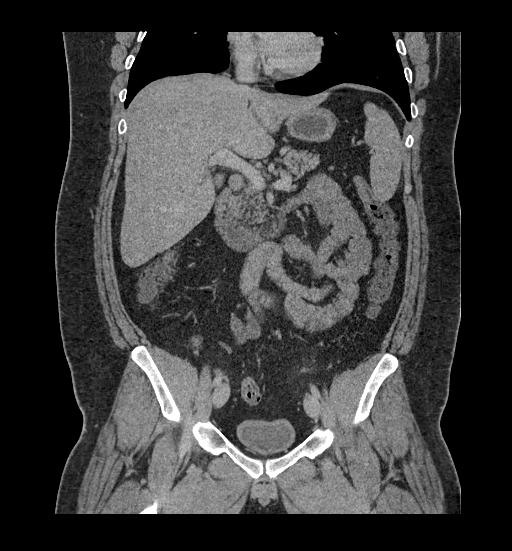
[im 86/155  soft-tissue]
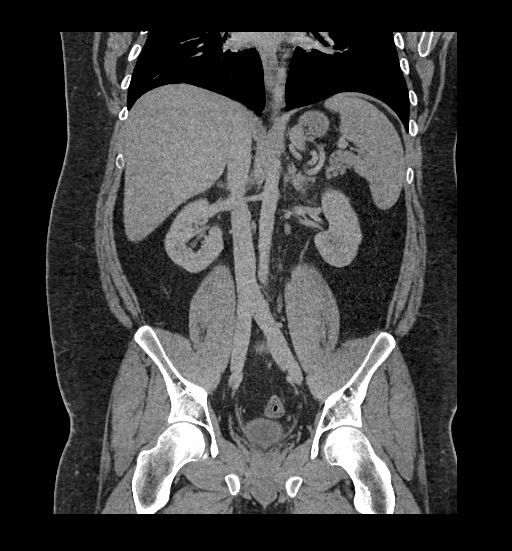

[16 of 46 positions shown; findings below may reference images not displayed]

FINDINGS: Lower chest: No acute abnormality.

Hepatobiliary: No focal liver abnormality is seen. No gallstones,
gallbladder wall thickening, or biliary dilatation.

Pancreas: Unremarkable. No pancreatic ductal dilatation or
surrounding inflammatory changes.

Spleen: The spleen measures 13.7 x 5.7 by 13.4 cm (volume = 550
cm^3)

Adrenals/Urinary Tract: Normal adrenal glands. No kidney stone, mass
or hydronephrosis identified bilaterally. Urinary bladder is
unremarkable.

Stomach/Bowel: The stomach appears nondistended. The appendix is
visualized and is within normal limits. No bowel wall thickening,
inflammation, or distension.

Vascular/Lymphatic: No significant vascular findings are present. No
enlarged abdominal or pelvic lymph nodes.

Reproductive: Prostate is unremarkable.

Other: Umbilical hernia is identified which contains fat. This
measures 2 x 2.3 by 2.1 cm, image 99/5 and image 58/2. Stranding and
a small amount of fluid is identified associated with the herniated
fat. Findings are concerning for incarceration with fat necrosis.

Musculoskeletal: No acute or suspicious osseous findings.
IMPRESSION: 1. No evidence for acute appendicitis.
2. Umbilical hernia is identified which contains fat. There is a
hyperdense rim surrounding the herniated fat along with a small
amount of fluid and soft tissue stranding. Suspicious for
incarceration and fat necrosis. Correlate for focal tenderness over
the area of the umbilicus
3. Splenomegaly.
